# Patient Record
Sex: Female | Born: 1990 | Race: White | Hispanic: No | Marital: Single | Smoking: Former smoker
Health system: Southern US, Community
[De-identification: ages and names within clinical notes are randomized; demographics above are authoritative.]

## PROBLEM LIST (undated history)

## (undated) DIAGNOSIS — F411 Generalized anxiety disorder: Secondary | ICD-10-CM

## (undated) DIAGNOSIS — K529 Noninfective gastroenteritis and colitis, unspecified: Secondary | ICD-10-CM

## (undated) DIAGNOSIS — F32A Depression, unspecified: Secondary | ICD-10-CM

## (undated) DIAGNOSIS — F329 Major depressive disorder, single episode, unspecified: Secondary | ICD-10-CM

## (undated) HISTORY — DX: Generalized anxiety disorder: F41.1

---

## 2013-09-26 ENCOUNTER — Emergency Department (INDEPENDENT_AMBULATORY_CARE_PROVIDER_SITE_OTHER): Payer: BC Managed Care – PPO

## 2013-09-26 ENCOUNTER — Emergency Department
Admission: EM | Admit: 2013-09-26 | Discharge: 2013-09-26 | Disposition: A | Discharge status: Home / Self Care | Payer: BC Managed Care – PPO | Source: Home / Self Care | Attending: Family Medicine | Admitting: Family Medicine

## 2013-09-26 ENCOUNTER — Encounter: Payer: Self-pay | Admitting: Emergency Medicine

## 2013-09-26 DIAGNOSIS — H6123 Impacted cerumen, bilateral: Secondary | ICD-10-CM

## 2013-09-26 DIAGNOSIS — H6120 Impacted cerumen, unspecified ear: Secondary | ICD-10-CM

## 2013-09-26 DIAGNOSIS — M25579 Pain in unspecified ankle and joints of unspecified foot: Secondary | ICD-10-CM

## 2013-09-26 DIAGNOSIS — H60399 Other infective otitis externa, unspecified ear: Secondary | ICD-10-CM

## 2013-09-26 DIAGNOSIS — H6092 Unspecified otitis externa, left ear: Secondary | ICD-10-CM

## 2013-09-26 DIAGNOSIS — M7750 Other enthesopathy of unspecified foot: Secondary | ICD-10-CM

## 2013-09-26 DIAGNOSIS — M7672 Peroneal tendinitis, left leg: Secondary | ICD-10-CM

## 2013-09-26 MED ORDER — NEOMYCIN-POLYMYXIN-HC 3.5-10000-1 OT SUSP: 4.0000 [drp] | Freq: Three times a day (TID) | OTIC | Status: DC

## 2013-09-26 MED ORDER — MELOXICAM 15 MG PO TABS: 15.0000 mg | ORAL_TABLET | Freq: Every day | ORAL | Status: DC

## 2013-09-26 NOTE — ED Notes (Signed)
Left ankle pain, radiates into leg Cerumen impaction

## 2013-09-26 NOTE — ED Provider Notes (Signed)
CSN: [id number]     Arrival date & time 09/26/13  6213 History   First MD Initiated Contact with Patient 09/26/13 1035     Chief Complaint  Patient presents with  . Ankle Pain    Left ankle pain x 2 days radiates up into leg  . Cerumen Impaction    left ear pain, both      HPI Comments: Patient complains of bilateral earache for one week.  No recent URI or nasal congestion. She also complains of onset of left ankle pain while walking at work.  The pain intermittently radiates up her leg to knee.  Pain is worse when walking.    Patient is a 22 y.o. female presenting with ankle pain and ear pain. The history is provided by the patient.  Ankle Pain Location:  Ankle Time since incident:  1 day Injury: no   Ankle location:  L ankle Pain details:    Quality:  Aching   Radiates to:  L leg   Severity:  Moderate   Onset quality:  Sudden   Duration:  1 day   Timing:  Intermittent   Progression:  Worsening Chronicity:  New Prior injury to area:  No Relieved by:  Nothing Ineffective treatments:  NSAIDs Associated symptoms: no decreased ROM, no fever, no muscle weakness, no neck pain, no numbness, no stiffness, no swelling and no tingling   Risk factors: obesity   Otalgia Location:  Bilateral Behind ear:  No abnormality Quality:  Aching Severity:  Mild Onset quality:  Gradual Duration:  1 week Timing:  Constant Progression:  Worsening Chronicity:  New Context: no water in ear   Relieved by:  Nothing Worsened by:  Nothing tried Ineffective treatments:  None tried Associated symptoms: headaches and hearing loss   Associated symptoms: no congestion, no ear discharge, no fever, no neck pain, no rash, no rhinorrhea and no sore throat     History reviewed. No pertinent past medical history. History reviewed. No pertinent past surgical history. No pertinent family history. History  Substance Use Topics  . Smoking status: Never Smoker   . Smokeless tobacco: Not on file  . Alcohol  Use: Not on file   OB History   Grav Para Term Preterm Abortions TAB SAB Ect Mult Living                 Review of Systems  Constitutional: Negative for fever.  HENT: Positive for hearing loss and ear pain. Negative for congestion, sore throat, rhinorrhea, neck pain and ear discharge.   Musculoskeletal: Negative for stiffness.  Skin: Negative for rash.  Neurological: Positive for headaches.  All other systems reviewed and are negative.    Allergies  Review of patient's allergies indicates no known allergies.  Home Medications   Current Outpatient Rx  Name  Route  Sig  Dispense  Refill  . meloxicam (MOBIC) 15 MG tablet   Oral   Take 1 tablet (15 mg total) by mouth daily. Take with food each morning   15 tablet   0   . neomycin-polymyxin-hydrocortisone (CORTISPORIN) 3.5-10000-1 otic suspension   Right Ear   Place 4 drops into the right ear 3 (three) times daily.   7.5 mL   0    BP 124/83  Pulse 82  Temp(Src) 98.1 F (36.7 C) (Oral)  Ht 5\' 10"  (1.778 m)  Wt 271 lb (122.925 kg)  BMI 38.88 kg/m2  SpO2 100%  LMP 08/19/2013 Physical Exam Nursing notes and Vital Signs reviewed.  Appearance:  Patient appears stated age, and in no acute distress.  Patient is obese (BMI 38.9) Eyes:  Pupils are equal, round, and reactive to light and accomodation.  Extraocular movement is intact.  Conjunctivae are not inflamed  Ears:   Canals are occluded with cerumen.  Post lavage, the right TM and canal appear normal.  Left canal is erythematous distally.  Left TM appears normal. Nose:  Mildly congested turbinates.  No sinus tenderness.   Pharynx:  Normal Neck:  Supple.   No adenopathy Lungs:  Clear to auscultation.  Breath sounds are equal.  Heart:  Regular rate and rhythm without murmurs, rubs, or gallops.  Extremities:   Left ankle:  No swelling.  Full range of motion.  Joint stable.  Tenderness to palpation just posterior to the lateral malleolus.  Palpation there during resisted  eversion causes pain Skin:  No rash present.   ED Course  Procedures  none   Labs Reviewed -  Tympanogram:  Normal left ear, low peak height right ear Imaging Review Dg Ankle Complete Left  09/26/2013   CLINICAL DATA:  Lateral ankle pain.  EXAM: LEFT ANKLE COMPLETE - 3+ VIEW  COMPARISON:  None.  FINDINGS: No acute osseous abnormality.  IMPRESSION: No acute osseous abnormality.   Electronically Signed   By: Leanna Battles M.D.   On: 09/26/2013 11:31    MDM   1. Bilateral impacted cerumen   2. Otitis externa, left   3. Peroneal tendonitis, left    Begin Cortisporin Otic Susp.   Begin Mobic.  Applied AirCast splint to left ankle. Wear AirCast ankle splint for about 7 to 10 days.  Apply ice pack 3 times daily for 2 to 3 days.  Begin range of motion exercises as per instruction sheet ([facility] information and instruction handout given).  Ensure well fitting shoes with arch supports. Followup with [facility] if not improving about two weeks.     Lattie Haw, MD 09/26/13 2004

## 2013-11-26 ENCOUNTER — Ambulatory Visit (INDEPENDENT_AMBULATORY_CARE_PROVIDER_SITE_OTHER): Payer: BC Managed Care – PPO | Admitting: Family Medicine

## 2013-11-26 ENCOUNTER — Encounter: Payer: Self-pay | Admitting: Family Medicine

## 2013-11-26 VITALS — BP 117/80 | HR 98 | Temp 97.9°F | Ht 70.0 in | Wt 272.0 lb

## 2013-11-26 DIAGNOSIS — F411 Generalized anxiety disorder: Secondary | ICD-10-CM

## 2013-11-26 DIAGNOSIS — A499 Bacterial infection, unspecified: Secondary | ICD-10-CM

## 2013-11-26 DIAGNOSIS — B9689 Other specified bacterial agents as the cause of diseases classified elsewhere: Secondary | ICD-10-CM

## 2013-11-26 DIAGNOSIS — L989 Disorder of the skin and subcutaneous tissue, unspecified: Secondary | ICD-10-CM

## 2013-11-26 DIAGNOSIS — J329 Chronic sinusitis, unspecified: Secondary | ICD-10-CM

## 2013-11-26 HISTORY — DX: Generalized anxiety disorder: F41.1

## 2013-11-26 MED ORDER — DOXYCYCLINE HYCLATE 100 MG PO TABS: ORAL_TABLET | ORAL | Status: AC

## 2013-11-26 MED ORDER — VENLAFAXINE HCL ER 75 MG PO CP24: ORAL_CAPSULE | ORAL | Status: DC

## 2013-11-26 NOTE — Progress Notes (Signed)
CC: Rhonda Hopkins is a 22 y.o. female is here for Establish Care and Sinusitis?   Subjective: HPI:  Very pleasant 22 year old here to establish care  Patient complains of one week of nasal congestion that evolved into a sore throat with subjective postnasal drip that has been present for the past 5-6 days. Symptoms are persistent present on the daily basis nothing particularly makes better or worse. She has tried taking left over amoxicillin 500 mg twice a day for the last 3 days without any improvement of the above symptoms. She reports fatigue denies fevers or chills. Symptoms can be present any time of the day they do not interfere with sleep  Patient complains of a history of depression and anxiety that began back in her teenage years when she was having trouble with coming out with her orientation. There is about 2-3 years where she was on Celexa and later one other medication for the above however both caused difficulty with a hand tremor. Symptoms have slightly return from a mild to moderate degree ever since moving here from [place] 5 months ago. Since or worse with stress of work and inability to take time off during the holidays. This is present on a daily basis and interfere with sleep. Denies thoughts of harm herself or others. Denies paranoia, hallucinations, or any mental disturbance other than above. Rare alcohol use no tobacco or recreational drug use  Concerned about a Lesion near her left ear that has been present for all life but slowly enlarging.  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 She denies any personal or family history of skin cancer.   Review Of Systems Outlined In HPI  History reviewed. No pertinent past medical history.   Family History  Problem Relation Age of Onset  . Breast cancer      grandmother  . Kidney cancer      grandfather  . Heart attack      grandfather  . Asthma Mother   . Diabetes Father      History  Substance Use  Topics  . Smoking status: Never Smoker   . Smokeless tobacco: Not on file  . Alcohol Use: Yes     Objective: Filed Vitals:   11/26/13 1400  BP: 117/80  Pulse: 98  Temp: 97.9 F (36.6 C)    General: Alert and Oriented, No Acute Distress HEENT: Pupils equal, round, reactive to light. Conjunctivae clear.  External ears unremarkable, canals clear with intact TMs with appropriate landmarks.  Middle ear appears open without effusion. Pink inferior turbinates.  Moist mucous membranes, pharynx without inflammation nor lesions however moderate cobblestoning.  Neck supple without palpable lymphadenopathy nor abnormal masses. Axillary sinus tenderness bilaterally  Lungs: Clear to auscultation bilaterally, no wheezing/ronchi/rales.  Comfortable work of breathing. Good air movement. Cardiac: Regular rate and rhythm. Normal S1/S2.  No murmurs, rubs, nor gallops.   Mental Status: No depression, anxiety, nor agitation. Skin: Warm and dry. There is a 5 mm pedunculated lesion just anterior to the left ear in the scalp line which is nontender flesh-colored  Assessment & Plan: Rhonda Hopkins was seen today for establish care and sinusitis?.  Diagnoses and associated orders for this visit:  Bacterial sinusitis - doxycycline (VIBRA-TABS) 100 MG tablet; One by mouth twice a day for ten days.  Generalized anxiety disorder - venlafaxine XR (EFFEXOR XR) 75 MG 24 hr capsule; One by mouth every morning for four days then two by mouth every morning thereafter.  Skin  lesion of face    Bacterial sinusitis: Start doxycycline stop Augmentin at home Generalized anxiety disorder: Uncontrolled, Start Effexor and followup in one month Skin lesion: This benign nevus, will refer to dermatology for removal it becomes painful she does not want to pay for cosmetic removal  Return in about 4 weeks (around 12/24/2013).

## 2014-01-30 ENCOUNTER — Emergency Department
Admission: EM | Admit: 2014-01-30 | Discharge: 2014-01-30 | Disposition: A | Discharge status: Home / Self Care | Payer: BC Managed Care – PPO | Source: Home / Self Care | Attending: Family Medicine | Admitting: Family Medicine

## 2014-01-30 ENCOUNTER — Encounter: Payer: Self-pay | Admitting: Emergency Medicine

## 2014-01-30 ENCOUNTER — Other Ambulatory Visit: Payer: Self-pay | Admitting: Family Medicine

## 2014-01-30 DIAGNOSIS — J039 Acute tonsillitis, unspecified: Secondary | ICD-10-CM

## 2014-01-30 DIAGNOSIS — J029 Acute pharyngitis, unspecified: Secondary | ICD-10-CM

## 2014-01-30 HISTORY — DX: Depression, unspecified: F32.A

## 2014-01-30 HISTORY — DX: Noninfective gastroenteritis and colitis, unspecified: K52.9

## 2014-01-30 HISTORY — DX: Major depressive disorder, single episode, unspecified: F32.9

## 2014-01-30 LAB — POCT CBC W AUTO DIFF (K'VILLE URGENT CARE)

## 2014-01-30 LAB — POCT MONO SCREEN (KUC): MONO, POC: NEGATIVE

## 2014-01-30 LAB — POCT RAPID STREP A (OFFICE): RAPID STREP A SCREEN: NEGATIVE

## 2014-01-30 MED ORDER — HYDROCODONE-ACETAMINOPHEN 5-325 MG PO TABS: ORAL_TABLET | ORAL | Status: DC

## 2014-01-30 MED ORDER — PREDNISONE 20 MG PO TABS: 20.0000 mg | ORAL_TABLET | Freq: Two times a day (BID) | ORAL | Status: DC

## 2014-01-30 NOTE — Discharge Instructions (Signed)
Try warm salt water gargles.  Finish Cipro

## 2014-01-30 NOTE — ED Notes (Signed)
Pt c/o dizziness and sore throat x 2 days. Denies fever. She reports going to the ED 2 days ago with c/o chest pain, SOB, fever, and increased HR. She reports a negative chest xray, strep and flu test. She reports dx of UTI and anxiety.

## 2014-01-30 NOTE — ED Provider Notes (Signed)
CSN: [id number]     Arrival date & time 01/30/14  1530 History   First MD Initiated Contact with Patient 01/30/14 1642     Chief Complaint  Patient presents with  . Sore Throat  . Dizziness      HPI Comments: Two days ago patient developed chest tightness, shortness of breath, fatigue, sore throat, fever, and increased heart rate.  She went to an ED where strep and flu tests were negative.  Chest X-ray and EKG were also normal.  She was diagnosed with a UTI and started on Cipro.  The history is provided by the patient.    Past Medical History  Diagnosis Date  . Generalized anxiety disorder 11/26/2013  . Colitis   . Depression    History reviewed. No pertinent past surgical history. Family History  Problem Relation Age of Onset  . Breast cancer      grandmother  . Kidney cancer      grandfather  . Heart attack      grandfather  . Asthma Mother   . Diabetes Father    History  Substance Use Topics  . Smoking status: Former Games developermoker  . Smokeless tobacco: Not on file  . Alcohol Use: Yes   OB History   Grav Para Term Preterm Abortions TAB SAB Ect Mult Living                 Review of Systems + sore throat + dizziness No cough No pleuritic pain, but has tightness in anterior chest. No wheezing No nasal congestion ? post-nasal drainage No sinus pain/pressure No itchy/red eyes ? earache No hemoptysis No SOB + fever, + chills + nausea No vomiting No abdominal pain No diarrhea No urinary symptoms No skin rash + fatigue + myalgias + headache Used OTC meds without relief  Allergies  Review of patient's allergies indicates no known allergies.  Home Medications   Current Outpatient Rx  Name  Route  Sig  Dispense  Refill  . ciprofloxacin (CIPRO) 250 MG tablet   Oral   Take 250 mg by mouth 2 (two) times daily.         Marland Kitchen. HYDROcodone-acetaminophen (NORCO/VICODIN) 5-325 MG per tablet      Take one by mouth Q6hr as needed for pain   12 tablet   0   .  predniSONE (DELTASONE) 20 MG tablet   Oral   Take 1 tablet (20 mg total) by mouth 2 (two) times daily. Take with food.   10 tablet   0   . venlafaxine XR (EFFEXOR XR) 75 MG 24 hr capsule      One by mouth every morning for four days then two by mouth every morning thereafter.   60 capsule   2    BP 122/81  Pulse 108  Temp(Src) 98.6 F (37 C) (Oral)  Resp 18  Ht 5\' 10"  (1.778 m)  Wt 261 lb (118.389 kg)  BMI 37.45 kg/m2  SpO2 100%  LMP 01/19/2014 Physical Exam Nursing notes and Vital Signs reviewed. Appearance:  Patient appears stated age, and in no acute distress.  Patient is obese (BMI 37.8) Eyes:  Pupils are equal, round, and reactive to light and accomodation.  Extraocular movement is intact.  Conjunctivae are not inflamed  Ears:  Canals normal.  Tympanic membranes normal.  Nose:  Mildly congested turbinates.  No sinus tenderness.   Pharynx:  Erythematous and slightly swollen without obstruction. Bilateral purulent exudate is present. Neck:  Supple.  Tender enlarged  anterior/posterior nodes are palpated bilaterally  Lungs:  Clear to auscultation.  Breath sounds are equal.  Heart:  Regular rate and rhythm without murmurs, rubs, or gallops.  Abdomen:  Tenderness over spleen without masses or hepatosplenomegaly.  Bowel sounds are present.  No CVA or flank tenderness.  Extremities:  No edema.  No calf tenderness Skin:  No rash present.   ED Course  Procedures none  Labs Reviewed  EPSTEIN-BARR VIRUS VCA, IGG - Abnormal; Notable for the following:    EBV VCA IgG 566.0 (*)    All other components within normal limits   Narrative:    Performed at:  [facility]                [address]                [place], [place] [address]  EPSTEIN-BARR VIRUS NUCLEAR ANTIGEN ANTIBODY, IGG - Abnormal; Notable for the following:    EBV NA IgG 530.0 (*)    All other components within normal limits   Narrative:    Performed at:  [facility]                 [address]                [place], [place] [address]  STREP A DNA PROBE  EPSTEIN-BARR VIRUS VCA, IGM   Narrative:    Performed at:  [facility]                [address]                [place], [place] [address]  EPSTEIN-BARR VIRUS EARLY D ANTIGEN ANTIBODY, IGG   Narrative:    Performed at:  [facility]                [address]                [place], [place] [address]  POCT RAPID STREP A (OFFICE):  Negative  POCT MONO SCREEN [facility]):  Negative  POCT CBC W AUTO DIFF ([facility]):  WBC 8.0; LY 17.4; MO 5.8; GR 76.8; Hgb 14.4; Platelets 202          MDM   1. Acute tonsillitis; suspect mono despite negative rapid test.  Normal white blood count is reassuring.  2. Acute pharyngitis    EBV titers pending Begin prednisone burst.  Lortab for pain. Followup with Family Doctor if not improved in about 5 days. If symptoms become significantly worse during the night or over the weekend, proceed to the local emergency room.     Lattie Haw, MD 02/02/14 [phone]

## 2014-01-31 LAB — EPSTEIN-BARR VIRUS EARLY D ANTIGEN ANTIBODY, IGG: EBV EA IgG: <5 U/mL (ref <9.0)

## 2014-01-31 LAB — EPSTEIN-BARR VIRUS VCA, IGM: EBV VCA IgM: <10 U/mL (ref <36.0)

## 2014-01-31 LAB — EPSTEIN-BARR VIRUS NUCLEAR ANTIGEN ANTIBODY, IGG: EBV NA IgG: 530 U/mL — ABNORMAL HIGH (ref <18.0)

## 2014-01-31 LAB — EPSTEIN-BARR VIRUS VCA, IGG: EBV VCA IGG: 566 U/mL — AB (ref <18.0)

## 2014-02-02 ENCOUNTER — Telehealth: Payer: Self-pay | Admitting: *Deleted

## 2014-02-12 ENCOUNTER — Ambulatory Visit: Payer: BC Managed Care – PPO | Admitting: Family Medicine

## 2014-02-12 ENCOUNTER — Ambulatory Visit (INDEPENDENT_AMBULATORY_CARE_PROVIDER_SITE_OTHER): Payer: BC Managed Care – PPO | Admitting: Sports Medicine

## 2014-02-12 ENCOUNTER — Encounter: Payer: Self-pay | Admitting: Sports Medicine

## 2014-02-12 VITALS — BP 121/73 | HR 103 | Ht 70.0 in | Wt 266.0 lb

## 2014-02-12 DIAGNOSIS — F319 Bipolar disorder, unspecified: Secondary | ICD-10-CM

## 2014-02-12 LAB — COMPREHENSIVE METABOLIC PANEL
ALT: 29 U/L (ref 0–35)
BUN: 10 mg/dL (ref 6–23)
CO2: 27 mEq/L (ref 19–32)
Calcium: 9.4 mg/dL (ref 8.4–10.5)
Chloride: 102 mEq/L (ref 96–112)
Creat: 0.79 mg/dL (ref 0.50–1.10)
Glucose, Bld: 70 mg/dL (ref 70–99)
Potassium: 4.4 mEq/L (ref 3.5–5.3)
Sodium: 140 mEq/L (ref 135–145)
Total Bilirubin: 0.3 mg/dL (ref 0.2–1.2)
Total Protein: 7.5 g/dL (ref 6.0–8.3)

## 2014-02-12 LAB — COMPREHENSIVE METABOLIC PANEL WITH GFR
AST: 16 U/L (ref 0–37)
Albumin: 4.2 g/dL (ref 3.5–5.2)
Alkaline Phosphatase: 47 U/L (ref 39–117)

## 2014-02-12 LAB — PREGNANCY, URINE: Preg Test, Ur: NEGATIVE

## 2014-02-12 MED ORDER — VENLAFAXINE HCL ER 37.5 MG PO CP24: 37.5000 mg | ORAL_CAPSULE | Freq: Every day | ORAL | Status: DC

## 2014-02-12 MED ORDER — DIVALPROEX SODIUM ER 500 MG PO TB24: 500.0000 mg | ORAL_TABLET | Freq: Every day | ORAL | Status: DC

## 2014-02-12 NOTE — Assessment & Plan Note (Addendum)
With a positive screen on the MDQ, clinically manic, with some depressive symptoms, I think we do need to discontinue venlafaxine. I discussed this with our psychiatrist downstairs, we are going to start a taper of Depakote. 500 mg for one day then 1000 mg daily. Certainly we could start an antipsychotic in the future as needed. Return to see us in 2 weeks, I am going to add a referral to psychiatry.

## 2014-02-12 NOTE — Progress Notes (Addendum)
  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 She has been increasingly anxious, and does endorse some depressed mood, she has taken citalopram in the past which caused tremor, more recently she was started on venlafaxine. Her mood is overall not better, and she does endorse worse anxiety, worsening urine bili, ability to get less sleep and not miss it, pressured speech and increased talkativeness, poor concentration and easy distraction, increased interest in sex than usual, and increased impulsivity and flu shot today. Her father does have a history of bipolar disorder. She denies any suicidal or homicidal ideation.  Past medical history, Surgical history, Family history not pertinant except as noted below, Social history, Allergies, and medications have been entered into the medical record, reviewed, and no changes needed.   Review of Systems: No fevers, chills, night sweats, weight loss, chest pain, or shortness of breath.   Objective:    General: Well Developed, well nourished, and in no acute distress.  Neuro: Alert and oriented x3, extra-ocular muscles intact, sensation grossly intact.  HEENT: Normocephalic, atraumatic, pupils equal round reactive to light, neck supple, no masses, no lymphadenopathy, thyroid nonpalpable.  Skin: Warm and dry, no rashes. Cardiac: Regular rate and rhythm, no murmurs rubs or gallops, no lower extremity edema.  Respiratory: Clear to auscultation bilaterally. Not using accessory muscles, speaking in full sentences.  Impression and Recommendations:    I spent 40 minutes with this patient, greater than 50% face to face time counseling regarding the multiple below diagnoses.

## 2014-02-12 NOTE — Patient Instructions (Signed)
Decrease Effexor to one tablet daily for 3 days, then take the 37.5 mg form daily for 3 days, then stop. Take one Depakote on day one, then 2 tabs daily afterwards.

## 2014-04-21 ENCOUNTER — Encounter: Payer: Self-pay | Admitting: Family Medicine

## 2014-04-21 ENCOUNTER — Ambulatory Visit (INDEPENDENT_AMBULATORY_CARE_PROVIDER_SITE_OTHER): Payer: BC Managed Care – PPO | Admitting: Family Medicine

## 2014-04-21 VITALS — BP 131/81 | HR 61 | Temp 97.9°F | Wt 257.0 lb

## 2014-04-21 DIAGNOSIS — F319 Bipolar disorder, unspecified: Secondary | ICD-10-CM

## 2014-04-21 DIAGNOSIS — R1032 Left lower quadrant pain: Secondary | ICD-10-CM

## 2014-04-21 LAB — POCT URINALYSIS DIPSTICK
Bilirubin, UA: NEGATIVE
Glucose, UA: NEGATIVE
Ketones, UA: NEGATIVE
Leukocytes, UA: NEGATIVE
Nitrite, UA: NEGATIVE
PH UA: 6
PROTEIN UA: NEGATIVE
Spec Grav, UA: >=1.03
Urobilinogen, UA: 0.2

## 2014-04-21 MED ORDER — HYDROCODONE-ACETAMINOPHEN 10-325 MG PO TABS: 0.5000 | ORAL_TABLET | Freq: Three times a day (TID) | ORAL | Status: AC | PRN

## 2014-04-21 MED ORDER — CIPROFLOXACIN HCL 500 MG PO TABS: 500.0000 mg | ORAL_TABLET | Freq: Two times a day (BID) | ORAL | Status: AC

## 2014-04-21 MED ORDER — METRONIDAZOLE 500 MG PO TABS: 500.0000 mg | ORAL_TABLET | Freq: Three times a day (TID) | ORAL | Status: AC

## 2014-04-21 NOTE — Progress Notes (Signed)
CC: Rhonda Hopkins is a 23 y.o. female is here for LLQ pain   Subjective: HPI:  Complains of left lower quadrant pain that has been present for the last one half weeks described as persistent and nonradiating. Nothing particularly makes it better or worse. It is described as constant and only pain moderate to severe in severity worsening on a daily basis. Has been accompanied by loose stools without diarrhea or blood or melena appearance. Symptoms can be present any time of the day but do not interfere with sleep. Symptoms are not influenced by dietary habits and not relieved by bowel movements. Reports subjective mild fevers and chills over the past few days. Denies nausea, lack of appetite, dysuria, vaginal discharge, flank pain, urinary frequency nor urgency. Denies gross blood in urine. She finished her menstrual cycle 2 days ago, she has a history of unpredictable menses that has not been getting better or worse recently.  Follow bipolar disorder: She has stopped taking Depakote as this was causing worsening subjective depression and causing crying spells. Denies any manic activity since stopping the medication. When she was called regarding scheduling for her psychiatry referral she was under the impression that this was for counseling and did not want to engage in counseling for management of her mental disturbance. She is open to the idea of seeing a psychiatrist for medication management.   Review Of Systems Outlined In HPI  Past Medical History  Diagnosis Date  . Generalized anxiety disorder 11/26/2013  . Colitis   . Depression     No past surgical history on file. Family History  Problem Relation Age of Onset  . Breast cancer      grandmother  . Kidney cancer      grandfather  . Heart attack      grandfather  . Asthma Mother   . Diabetes Father     History   Social History  . Marital Status: Single    Spouse Name: N/A    Number of Children: N/A  . Years of Education: N/A    Occupational History  . Not on file.   Social History Main Topics  . Smoking status: Former Games developermoker  . Smokeless tobacco: Not on file  . Alcohol Use: Yes  . Drug Use: No  . Sexual Activity: Not Currently    Partners: Female   Other Topics Concern  . Not on file   Social History Narrative  . No narrative on file     Objective: BP 131/81  Pulse 61  Temp(Src) 97.9 F (36.6 C) (Oral)  Wt 257 lb (116.574 kg)  General: Alert and Oriented, No Acute Distress HEENT: Pupils equal, round, reactive to light. Conjunctivae clear.  Moist mucous membranes pharynx unremarkable Lungs: Clear to auscultation bilaterally, no wheezing/ronchi/rales.  Comfortable work of breathing. Good air movement. Cardiac: Regular rate and rhythm. Normal S1/S2.  No murmurs, rubs, nor gallops.   Abdomen: Normal bowel sounds, soft without rebound or guarding. Pain is reproduced with palpation of left lower quadrant, all other quadrants do not elicit any pain. No palpable masses Back: No CVA tenderness  Mental Status: No depression, anxiety, nor agitation. Skin: Warm and dry.  Assessment & Plan: Rhonda Hopkins was seen today for llq pain.  Diagnoses and associated orders for this visit:  Abdominal pain, left lower quadrant - Urinalysis Dipstick - Urine Culture  Bipolar disorder, unspecified - Ambulatory referral to Psychiatry  LLQ pain - ciprofloxacin (CIPRO) 500 MG tablet; Take 1 tablet (500 mg total) by  mouth 2 (two) times daily. - metroNIDAZOLE (FLAGYL) 500 MG tablet; Take 1 tablet (500 mg total) by mouth 3 (three) times daily. - HYDROcodone-acetaminophen (NORCO) 10-325 MG per tablet; Take 0.5-1 tablets by mouth every 8 (eight) hours as needed.    Left lower quadrant pain: With her history of bacterial colitis we'll treat with Cipro and metronidazole, she is unable to afford a CT scan at this time which I think is reasonable for postponing unless symptoms are progressing or not improving by Thursday.  Hydrocodone for pain. We'll follow culture for moderate blood on UA today, currently low suspicion for nephrolithiasis. Bipolar disorder: Stable, referral to psychiatry has been placed  Return if symptoms worsen or fail to improve.

## 2014-04-22 ENCOUNTER — Telehealth: Payer: Self-pay | Admitting: Family Medicine

## 2014-04-22 NOTE — Telephone Encounter (Signed)
Excuse for work.

## 2014-04-23 LAB — URINE CULTURE
Colony Count: NO GROWTH
Organism ID, Bacteria: NO GROWTH

## 2014-04-24 ENCOUNTER — Telehealth (HOSPITAL_COMMUNITY): Payer: Self-pay

## 2014-06-08 IMAGING — CR DG ANKLE COMPLETE 3+V*L*
3 series · 3 of 3 positions shown · non-contrast
Comparison: None.

CLINICAL DATA: Lateral ankle pain.

EXAM:
LEFT ANKLE COMPLETE - 3+ VIEW

[view not recorded (1 of 3)]
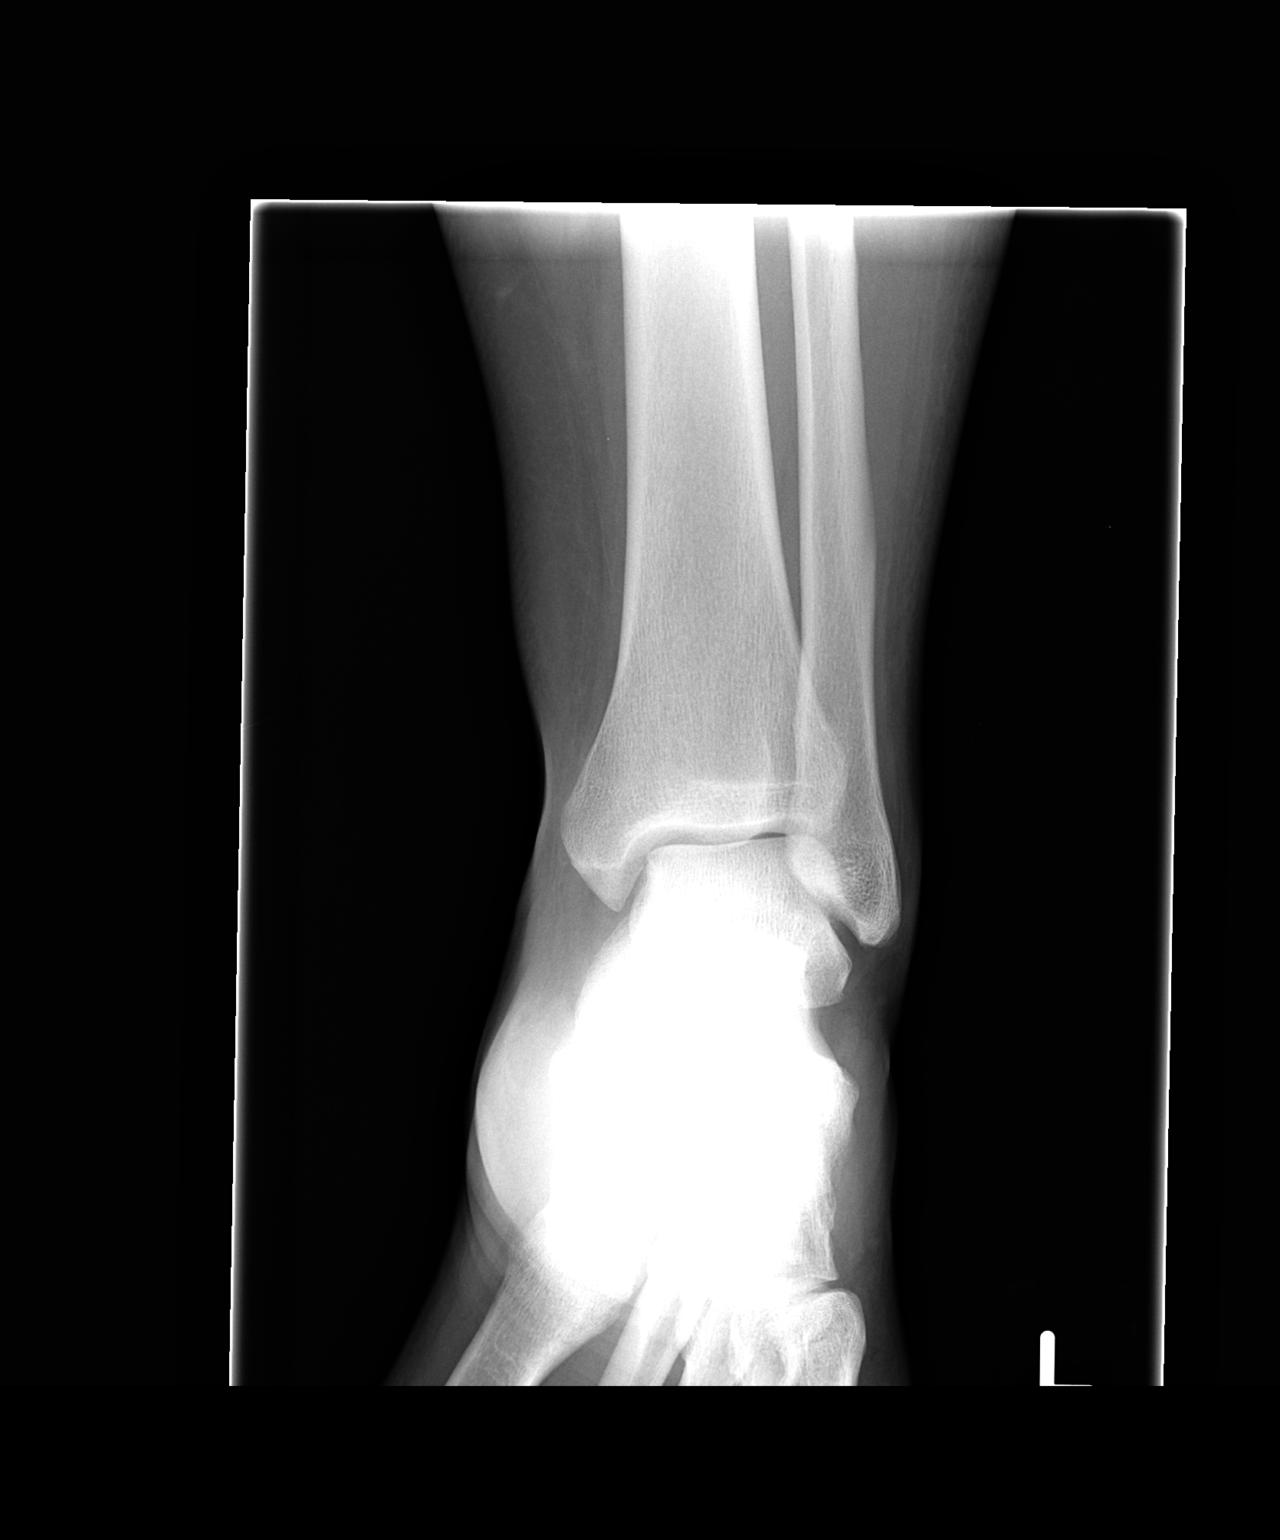

[view not recorded (2 of 3)]
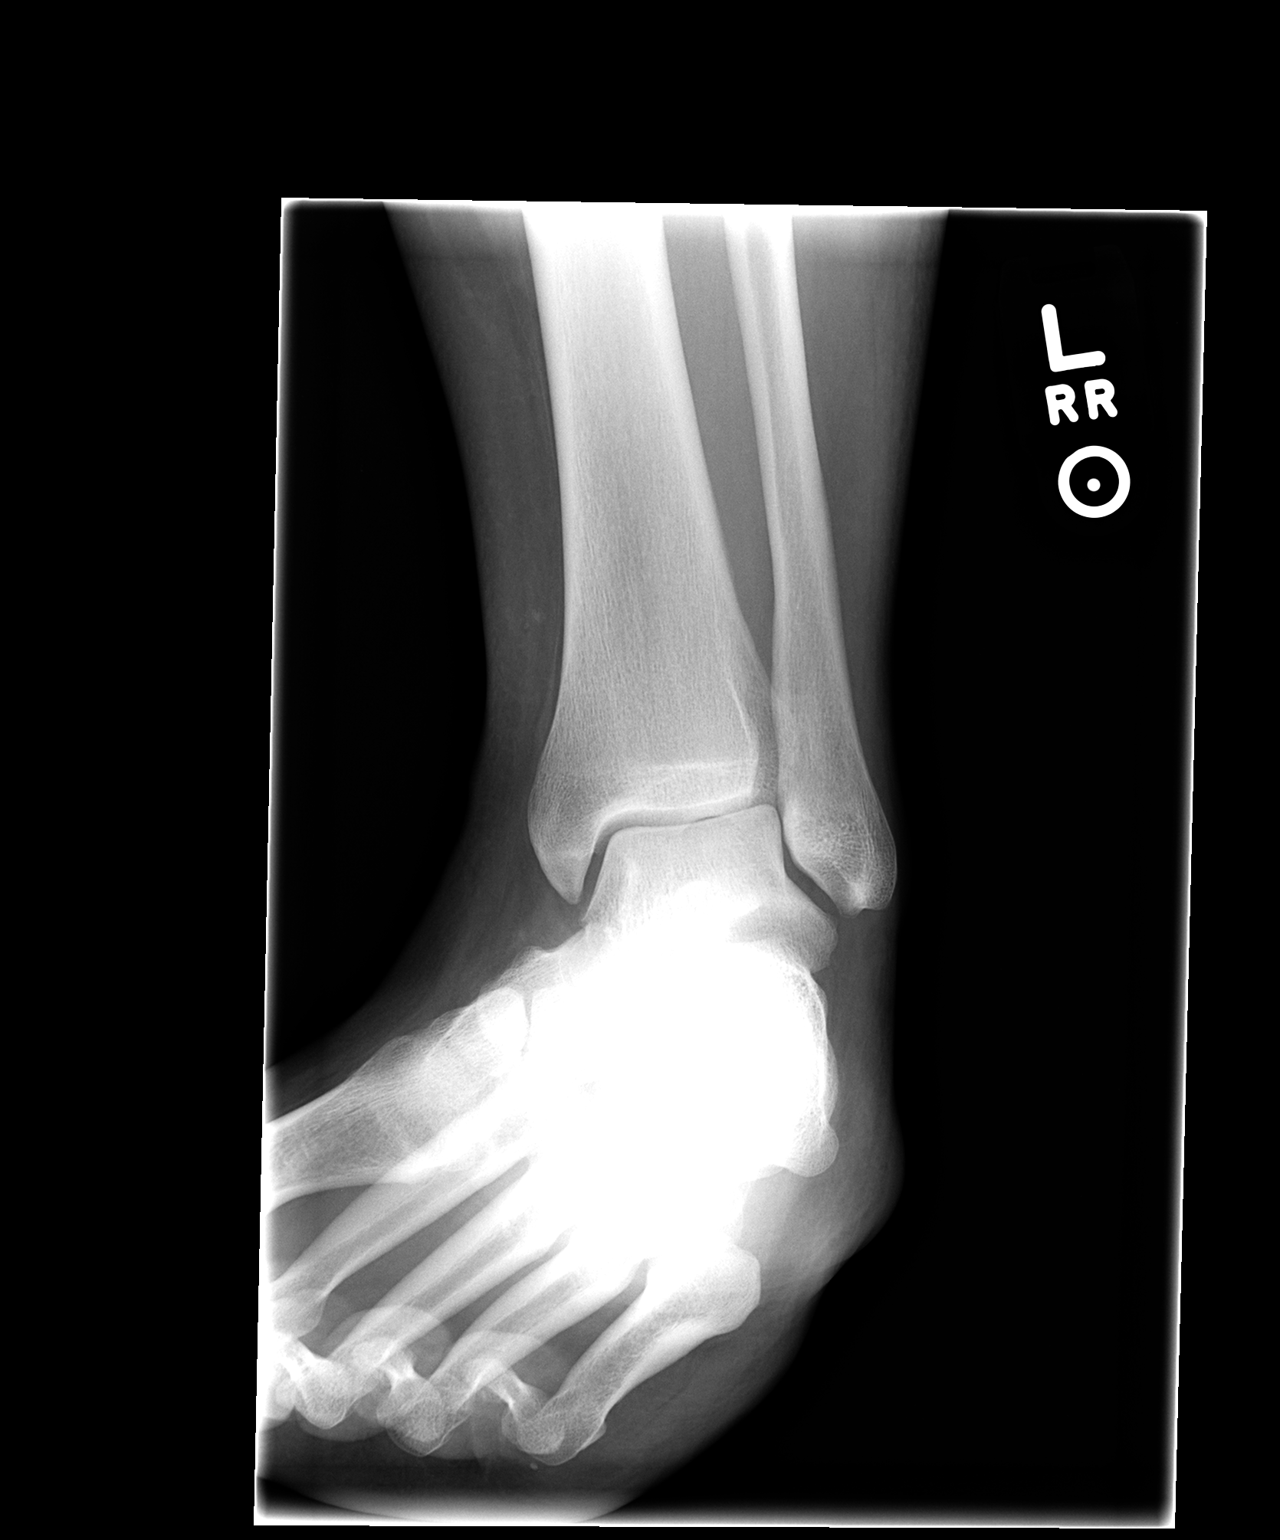

[view not recorded (3 of 3)]
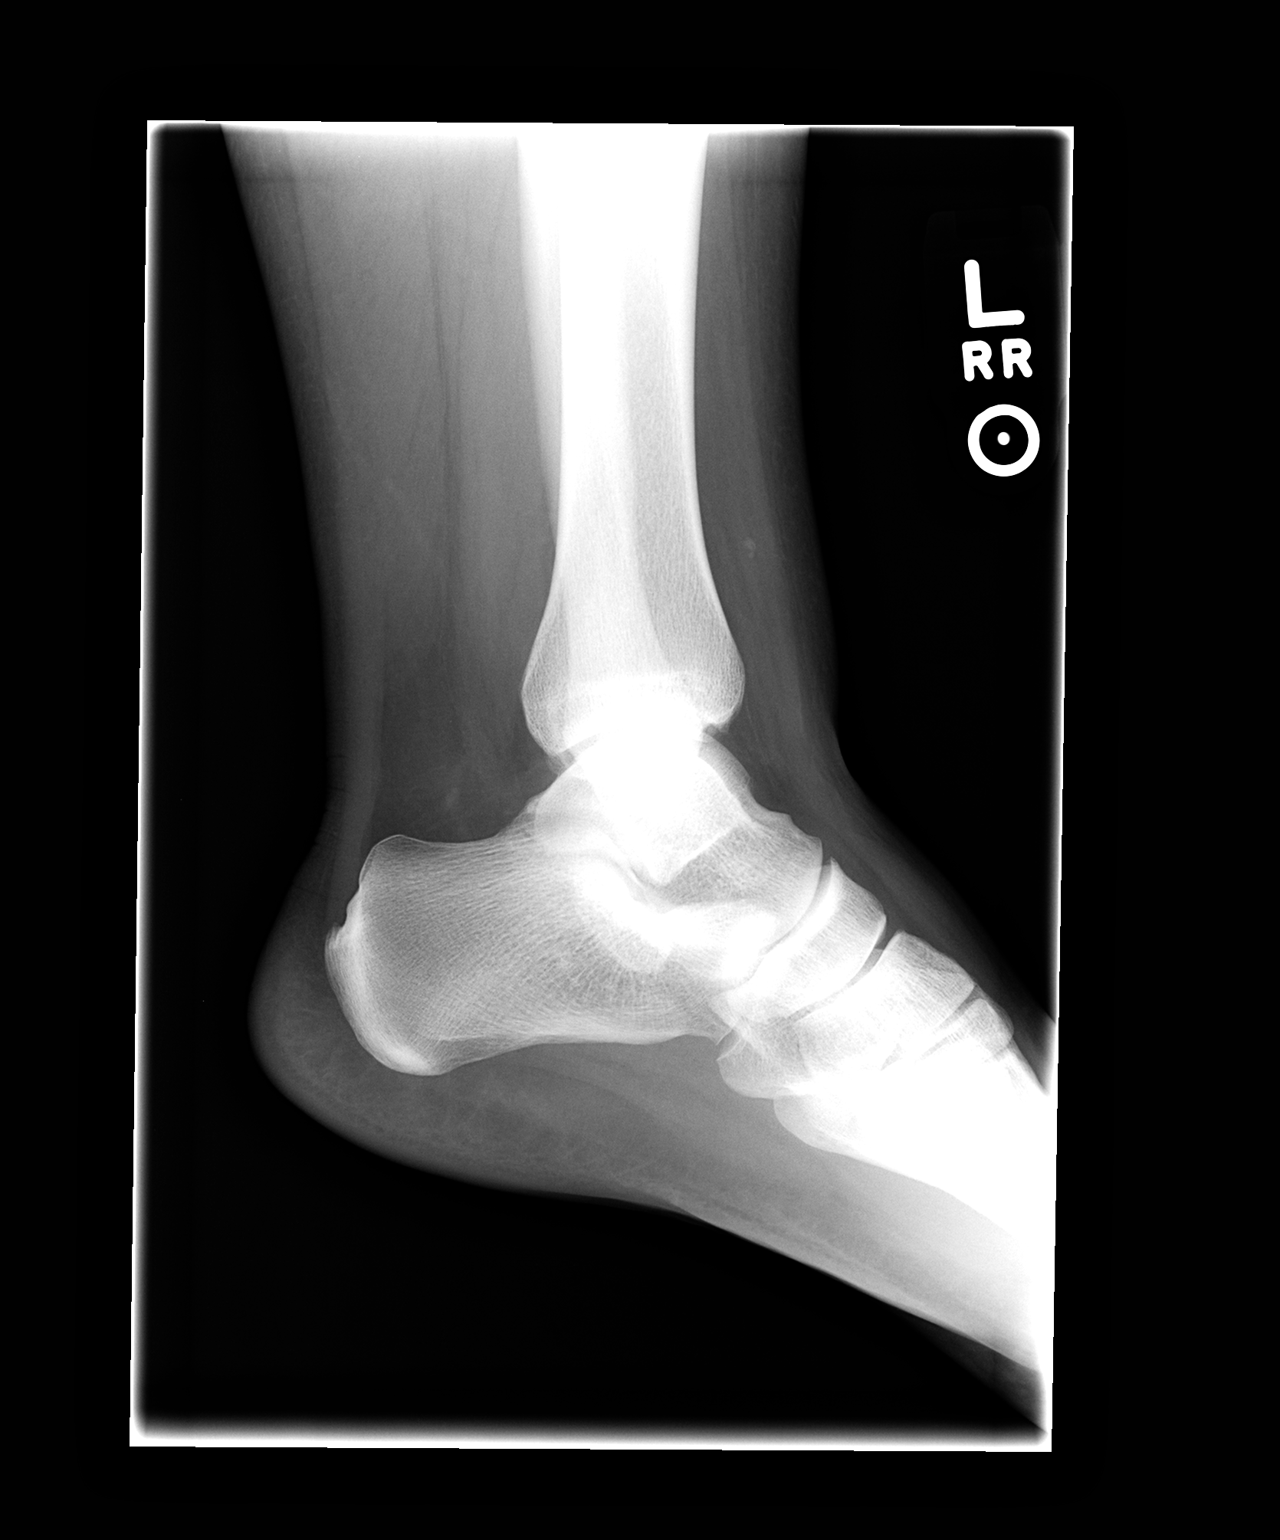

[3 of 3 positions shown; findings below may reference images not displayed]

FINDINGS: No acute osseous abnormality.
IMPRESSION: No acute osseous abnormality.

## 2014-06-22 ENCOUNTER — Ambulatory Visit (HOSPITAL_COMMUNITY): Payer: BC Managed Care – PPO | Admitting: Psychiatry

## 2014-09-04 ENCOUNTER — Inpatient Hospital Stay
Admit: 2014-09-04 | Discharge: 2014-09-05 | Disposition: A | Discharge status: Home / Self Care | Attending: Emergency Medicine

## 2014-09-04 NOTE — ED Provider Notes (Signed)
PATIENT:          Sharon Walsh, Sharon Walsh         DOS:           09/04/2014  MR #:             [id number]             ACCOUNT #:     [id number]  DATE OF BIRTH:    03/17/1991              AGE:           23      PROBLEM LIST:     headaches: Entered Date: 04-Sep-2014 19:54, Entered By:  Carin Hock,  INTERFACES    HISTORY OF PRESENT ILLNESS:    PERTINENT HISTORY OF PRESENT  ILLNESS. This patient presents with  headaches  that began 3 days ago.  Patient states the pains are worse at  nighttime.  Patient states he wakes up with the headaches and developed nausea  and  vomiting.  Patient denies any fevers or chills.  Patient denies any  visual  changes.  Patient denies any neck pain.    PERTINENT PAST/ FAMILY/SOCIAL HISTORY Past medical history includes  colitis      PHYSICAL EXAM Vital signs are stable.  Patient is afebrile.  Patient  is in no  acute distress.  Cranial nerves II through XII are intact.  There are  no focal  motor or sensory deficits noted.  Pupils are equal, round, and  reactive to  light.  Extraocular muscles are intact.  Conjunctiva was clear  bilaterally.  Neck is supple.  Trachea is midline.  There are no meningeal signs  noted.  The  remaining physical exams within normal limits.    MEDICAL DECISION MAKING:    SIGNIFICANT FINDINGS/ED COURSE/MEDICAL DECISION MAKING/TREATMENT  PLAN  Computed tomography scan of the brain was obtained.  There is no  acute  intracranial process.  Patient declined analgesics this time.  Patient  was  instructed to followup with her primary care physician as scheduled.  Patient  understood and was agreeable with the plan.  All questions were  answered.      DIAGNOSIS 1.  Cephalgia      CC: Dr. Arnetha Massy DO    Electronic Signatures:  Sukhmani Fetherolf (DO)  (Signed 04-Sep-2014 20:38)   Authored: PROBLEM LIST, HISTORY OF PRESENT ILLNESS, PHYSICAL EXAM,  MEDICAL  DECISION MAKING, DIAGNOSIS, Copies to be sent to:      Last Updated: 04-Sep-2014 20:38 by Varnell Donate  (DO)            Please see T-Sheet, initial assessment, and physician orders for  further details.    Dictating Physician: Melton Alar, DO  Original Electronic Signature Date: 09/04/2014 08:03 P  EJS  Document #: [id number]    cc:

## 2015-08-03 NOTE — ED Provider Notes (Signed)
PATIENT:          Sharon Walsh, Sharon Walsh         DOS:           08/03/2015  MR #:             [id number]             ACCOUNT #:     [id number]  DATE OF BIRTH:    December 04, 1991              AGE:           24      HISTORY OF PRESENT ILLNESS:    PERTINENT HISTORY OF PRESENT  ILLNESS. Patient seen under the  supervision of  Dr. Geralynn Ochs.  Patient presents with a chief complaint of abdominal  pain and  diarrhea.  This started a few days ago, her abdominal pain is  currently a 2 out  of 10, worsened by nothing and relieved by nothing.  It is diffuse  and  cramping.  She has had a moderate amount of nonbloody diarrhea.  She  believes  that she may have C. difficile, she does work with disabled adults.  Her  symptoms started 4 days ago, her work is requesting a work release  stating that  she can come back.  She has not been tested for C. difficile yet.  She  attempted to call her PCP, but was not able to get an appointment.  She denies  recent antibiotic use, fever, chills, nausea or vomiting.    PERTINENT PAST/ FAMILY/SOCIAL HISTORY No pertinent past medical  history        PHYSICAL EXAM Vitals stable.  Patient alert and oriented x3, in no  acute  distress.  Lung sounds are clear on auscultation bilaterally,  unlabored.  Heart  rate regular, no murmurs auscultated.  Abdomen is soft, nondistended,  nontender, bowel sounds are present and no masses noted.    MEDICAL DECISION MAKING:    SIGNIFICANT FINDINGS/ED COURSE/MEDICAL DECISION MAKING/TREATMENT  PLAN I  discussed with the patient that I cannot give her a work release if  she  believes that she may have come into contact with C. difficile and now  has  diarrhea.  I did order a C. difficile stable here in the emergency  department,  I discussed with her that this may take a couple of days to come back  and that  she needs to call her PCPs office for an appointment. I do not believe  that  antibiotics are warranted at this time.  After ordering the C.  difficile  sample, the  patient is unable to provide a specimen.  I instructed her  that she  needs to follow-up with her PCP as this can be ordered as an  outpatient.  Patient is agreeable with this plan.  She is discharged in stable  condition.    PROBLEM LIST:       ED Diagnosis:     Diarrhea (R19.7): Entered Date: 03-Aug-2015 12:33, Entered By:  Richrd Sox, Status: Active, ICD-10: R19.7        ADDITIONAL INFORMATION If the physician assistant/nurse practitioner  was  involved in patient care, I personally performed and participated in  all the  above services (including HPI and PE). I have reviewed with the  physician  assistant/nurse practitioner the history and confirmed the findings  with the  patient. I personally performed all surgical procedures in the medical  record  unless otherwise indicated.      COPIES SENT TO::     Alexis Frock MARIE(PCP): [id number]     UnKnown, [id number](PCP): [id number]    Electronic Signatures:  Adria Devon (MD)  (Signed 03-Aug-2015 20:00)   Co-Signer: HISTORY OF PRESENT ILLNESS, PHYSICAL EXAM, MEDICAL  DECISION MAKING,  PROBLEM LIST, Additional Infomation, Copies to be sent to:  Richrd Sox (NP)  (Signed 03-Aug-2015 12:58)   Authored: HISTORY OF PRESENT ILLNESS, PHYSICAL EXAM, MEDICAL DECISION  MAKING,  PROBLEM LIST, Additional Infomation, Copies to be sent to:      Last Updated: 03-Aug-2015 20:00 by Adria Devon (MD)            Please see T-Sheet, initial assessment, and physician orders for  further details.    Dictating Physician: Grandville Silos, CNP  Original Electronic Signature Date: 08/03/2015 12:34 P  AMN  Document #: [id number]    cc:  G[place], MD       [phone] [facility] [address]       [place] [address]         Patrici Ranks, DO       [facility]       [address]       [place] [address]

## 2015-08-07 LAB — CULTURE, STOOL

## 2016-11-14 NOTE — Other (Unsigned)
Patient Acct Nbr: [id number]   Primary AUTH/CERT:   Primary Insurance Company Name: [employer]  Primary Insurance Plan name: [employer]  Primary Insurance Group Number: [id number]  Primary Insurance Plan Type: Health  Primary Insurance Policy Number: [id number]

## 2017-12-02 NOTE — Other (Unsigned)
Patient Acct Nbr: [id number]   Primary AUTH/CERT:   Primary Insurance Company Name: [employer] [facility] [employer] [employer]  Primary Insurance Plan name: [employer]  Primary Insurance Group Number: [id number]  Primary Insurance Plan Type: Health  Primary Insurance Policy Number: [id number]
